# Patient Record
Sex: Male | Born: 1995 | Hispanic: No | Marital: Single | Smoking: Current some day smoker
Health system: Southern US, Community
[De-identification: ages and names within clinical notes are randomized; demographics above are authoritative.]

## PROBLEM LIST (undated history)

## (undated) DIAGNOSIS — H9190 Unspecified hearing loss, unspecified ear: Secondary | ICD-10-CM

---

## 2016-06-04 ENCOUNTER — Emergency Department (INDEPENDENT_AMBULATORY_CARE_PROVIDER_SITE_OTHER): Payer: Self-pay

## 2016-06-04 ENCOUNTER — Emergency Department (INDEPENDENT_AMBULATORY_CARE_PROVIDER_SITE_OTHER)
Admission: EM | Admit: 2016-06-04 | Discharge: 2016-06-04 | Disposition: A | Discharge status: Home / Self Care | Payer: Self-pay | Source: Home / Self Care | Attending: Family Medicine | Admitting: Family Medicine

## 2016-06-04 ENCOUNTER — Encounter: Payer: Self-pay | Admitting: *Deleted

## 2016-06-04 DIAGNOSIS — S93402A Sprain of unspecified ligament of left ankle, initial encounter: Secondary | ICD-10-CM

## 2016-06-04 DIAGNOSIS — T07XXXA Unspecified multiple injuries, initial encounter: Secondary | ICD-10-CM

## 2016-06-04 DIAGNOSIS — M25521 Pain in right elbow: Secondary | ICD-10-CM

## 2016-06-04 DIAGNOSIS — M25572 Pain in left ankle and joints of left foot: Secondary | ICD-10-CM

## 2016-06-04 DIAGNOSIS — S51811A Laceration without foreign body of right forearm, initial encounter: Secondary | ICD-10-CM

## 2016-06-04 DIAGNOSIS — Z23 Encounter for immunization: Secondary | ICD-10-CM

## 2016-06-04 DIAGNOSIS — M25562 Pain in left knee: Secondary | ICD-10-CM

## 2016-06-04 DIAGNOSIS — T148 Other injury of unspecified body region: Secondary | ICD-10-CM

## 2016-06-04 HISTORY — DX: Unspecified hearing loss, unspecified ear: H91.90

## 2016-06-04 MED ORDER — TETANUS-DIPHTH-ACELL PERTUSSIS 5-2.5-18.5 LF-MCG/0.5 IM SUSP
0.5000 mL | Freq: Once | INTRAMUSCULAR | Status: AC
  Administered 2016-06-04: 0.5 mL via INTRAMUSCULAR

## 2016-06-04 MED ORDER — IBUPROFEN 600 MG PO TABS
600.0000 mg | ORAL_TABLET | Freq: Once | ORAL | Status: AC
  Administered 2016-06-04: 600 mg via ORAL

## 2016-06-04 MED ORDER — IBUPROFEN 600 MG PO TABS: 600.0000 mg | ORAL_TABLET | Freq: Four times a day (QID) | ORAL | 0 refills | Status: AC | PRN
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
## 2016-06-17 ENCOUNTER — Encounter: Payer: Self-pay | Admitting: *Deleted

## 2016-06-17 ENCOUNTER — Emergency Department (INDEPENDENT_AMBULATORY_CARE_PROVIDER_SITE_OTHER)
Admission: EM | Admit: 2016-06-17 | Discharge: 2016-06-17 | Disposition: A | Discharge status: Home / Self Care | Payer: Medicaid Other | Source: Home / Self Care | Attending: Family Medicine | Admitting: Family Medicine

## 2016-06-17 DIAGNOSIS — S80811A Abrasion, right lower leg, initial encounter: Secondary | ICD-10-CM

## 2016-06-17 DIAGNOSIS — L03115 Cellulitis of right lower limb: Secondary | ICD-10-CM | POA: Diagnosis not present

## 2016-06-17 MED ORDER — TRAMADOL HCL 50 MG PO TABS: ORAL_TABLET | ORAL | 0 refills | Status: AC

## 2016-06-17 MED ORDER — DOXYCYCLINE HYCLATE 100 MG PO CAPS: 100.0000 mg | ORAL_CAPSULE | Freq: Two times a day (BID) | ORAL | 0 refills | Status: AC
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
## 2016-06-17 NOTE — Discharge Instructions (Signed)
Leave bandage in place until follow-up visit; keep bandage clean and dry.  May continue ibuprofen.  If increasing pain, redness, or swelling occur, return earlier.
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
